# Patient Record
Sex: Female | Born: 1990 | Hispanic: No | Marital: Single | Smoking: Current every day smoker
Health system: Southern US, Community
[De-identification: ages and names within clinical notes are randomized; demographics above are authoritative.]

## PROBLEM LIST (undated history)

## (undated) DIAGNOSIS — R519 Headache, unspecified: Secondary | ICD-10-CM

## (undated) DIAGNOSIS — R51 Headache: Secondary | ICD-10-CM

## (undated) DIAGNOSIS — M419 Scoliosis, unspecified: Secondary | ICD-10-CM

## (undated) HISTORY — PX: NO PAST SURGERIES: SHX2092

## (undated) HISTORY — PX: HERNIA REPAIR: SHX51

---
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
## 2011-01-28 LAB — ANTIBODY SCREEN: Antibody Screen: NEGATIVE

## 2011-01-28 LAB — ABO/RH: RH Type: NEGATIVE

## 2011-07-24 LAB — STREP B DNA PROBE: GBS: NEGATIVE

## 2011-08-16 ENCOUNTER — Inpatient Hospital Stay (HOSPITAL_COMMUNITY)
Admission: AD | Admit: 2011-08-16 | Discharge: 2011-08-19 | DRG: 775 | Disposition: A | Discharge status: Home / Self Care | Payer: Medicaid Other | Source: Ambulatory Visit | Attending: Obstetrics & Gynecology | Admitting: Obstetrics & Gynecology

## 2011-08-16 ENCOUNTER — Inpatient Hospital Stay (HOSPITAL_COMMUNITY): Payer: Medicaid Other | Admitting: Anesthesiology

## 2011-08-16 ENCOUNTER — Encounter (HOSPITAL_COMMUNITY): Payer: Self-pay | Admitting: Anesthesiology

## 2011-08-16 ENCOUNTER — Encounter (HOSPITAL_COMMUNITY): Payer: Self-pay

## 2011-08-16 DIAGNOSIS — N898 Other specified noninflammatory disorders of vagina: Secondary | ICD-10-CM

## 2011-08-16 HISTORY — DX: Scoliosis, unspecified: M41.9

## 2011-08-16 LAB — CBC
Hemoglobin: 12 g/dL (ref 12.0–15.0)
MCH: 29.2 pg (ref 26.0–34.0)
RBC: 4.11 MIL/uL (ref 3.87–5.11)

## 2011-08-16 LAB — RPR: RPR Ser Ql: NONREACTIVE

## 2011-08-16 LAB — AMNISURE RUPTURE OF MEMBRANE (ROM) NOT AT ARMC: Amnisure ROM: POSITIVE

## 2011-08-16 MED ORDER — PHENYLEPHRINE 40 MCG/ML (10ML) SYRINGE FOR IV PUSH (FOR BLOOD PRESSURE SUPPORT)
80.0000 ug | PREFILLED_SYRINGE | INTRAVENOUS | Status: DC | PRN
  Administered 2011-08-16: 80 ug via INTRAVENOUS

## 2011-08-16 MED ORDER — OXYTOCIN BOLUS FROM INFUSION
500.0000 mL | Freq: Once | INTRAVENOUS | Status: DC
  Filled 2011-08-16: qty 1000
  Filled 2011-08-16: qty 500

## 2011-08-16 MED ORDER — FENTANYL 2.5 MCG/ML BUPIVACAINE 1/10 % EPIDURAL INFUSION (WH - ANES)
14.0000 mL/h | INTRAMUSCULAR | Status: DC
  Administered 2011-08-17 (×3): 14 mL/h via EPIDURAL
  Filled 2011-08-16 (×4): qty 60

## 2011-08-16 MED ORDER — ACETAMINOPHEN 325 MG PO TABS
650.0000 mg | ORAL_TABLET | ORAL | Status: DC | PRN
  Administered 2011-08-17: 650 mg via ORAL
  Filled 2011-08-16: qty 2

## 2011-08-16 MED ORDER — CITRIC ACID-SODIUM CITRATE 334-500 MG/5ML PO SOLN
30.0000 mL | ORAL | Status: DC | PRN
  Administered 2011-08-17: 30 mL via ORAL
  Filled 2011-08-16: qty 15

## 2011-08-16 MED ORDER — LACTATED RINGERS IV SOLN
INTRAVENOUS | Status: DC
  Administered 2011-08-16: 21:00:00 via INTRAVENOUS

## 2011-08-16 MED ORDER — FLEET ENEMA 7-19 GM/118ML RE ENEM: 1.0000 | ENEMA | RECTAL | Status: DC | PRN

## 2011-08-16 MED ORDER — OXYTOCIN 20 UNITS IN LACTATED RINGERS INFUSION - SIMPLE
1.0000 m[IU]/min | INTRAVENOUS | Status: DC
  Administered 2011-08-16: 2 m[IU]/min via INTRAVENOUS
  Administered 2011-08-17: 16 m[IU]/min via INTRAVENOUS
  Filled 2011-08-16: qty 1000

## 2011-08-16 MED ORDER — TERBUTALINE SULFATE 1 MG/ML IJ SOLN: 0.2500 mg | Freq: Once | INTRAMUSCULAR | Status: AC | PRN

## 2011-08-16 MED ORDER — EPHEDRINE 5 MG/ML INJ
10.0000 mg | INTRAVENOUS | Status: DC | PRN
  Filled 2011-08-16: qty 4

## 2011-08-16 MED ORDER — IBUPROFEN 600 MG PO TABS
600.0000 mg | ORAL_TABLET | Freq: Four times a day (QID) | ORAL | Status: DC | PRN
  Administered 2011-08-17: 600 mg via ORAL
  Filled 2011-08-16: qty 1

## 2011-08-16 MED ORDER — DIPHENHYDRAMINE HCL 50 MG/ML IJ SOLN: 12.5000 mg | INTRAMUSCULAR | Status: DC | PRN

## 2011-08-16 MED ORDER — ONDANSETRON HCL 4 MG/2ML IJ SOLN: 4.0000 mg | Freq: Four times a day (QID) | INTRAMUSCULAR | Status: DC | PRN

## 2011-08-16 MED ORDER — LIDOCAINE HCL 1.5 % IJ SOLN
INTRAMUSCULAR | Status: DC | PRN
  Administered 2011-08-16 (×2): 4 mL via EPIDURAL

## 2011-08-16 MED ORDER — LIDOCAINE HCL (PF) 1 % IJ SOLN
30.0000 mL | INTRAMUSCULAR | Status: DC | PRN
  Filled 2011-08-16: qty 30

## 2011-08-16 MED ORDER — LACTATED RINGERS IV SOLN: 500.0000 mL | Freq: Once | INTRAVENOUS | Status: DC

## 2011-08-16 MED ORDER — LACTATED RINGERS IV SOLN
500.0000 mL | INTRAVENOUS | Status: DC | PRN
  Administered 2011-08-16: 1000 mL via INTRAVENOUS

## 2011-08-16 MED ORDER — PHENYLEPHRINE 40 MCG/ML (10ML) SYRINGE FOR IV PUSH (FOR BLOOD PRESSURE SUPPORT)
80.0000 ug | PREFILLED_SYRINGE | INTRAVENOUS | Status: DC | PRN
  Filled 2011-08-16: qty 5

## 2011-08-16 MED ORDER — OXYTOCIN 20 UNITS IN LACTATED RINGERS INFUSION - SIMPLE
125.0000 mL/h | Freq: Once | INTRAVENOUS | Status: AC
  Administered 2011-08-17: 125 mL/h via INTRAVENOUS

## 2011-08-16 MED ORDER — EPHEDRINE 5 MG/ML INJ: 10.0000 mg | INTRAVENOUS | Status: DC | PRN

## 2011-08-16 MED ORDER — FENTANYL 2.5 MCG/ML BUPIVACAINE 1/10 % EPIDURAL INFUSION (WH - ANES)
INTRAMUSCULAR | Status: DC | PRN
  Administered 2011-08-16: 14 mL/h via EPIDURAL

## 2011-08-16 MED ORDER — OXYCODONE-ACETAMINOPHEN 5-325 MG PO TABS
2.0000 | ORAL_TABLET | ORAL | Status: DC | PRN
  Administered 2011-08-17: 1 via ORAL
  Filled 2011-08-16: qty 1

## 2011-08-16 MED ORDER — NALBUPHINE SYRINGE 5 MG/0.5 ML: 5.0000 mg | INJECTION | INTRAMUSCULAR | Status: DC | PRN

## 2011-08-16 NOTE — Progress Notes (Signed)
Patient states that about 0100 she got up to go to the bathroom and her panties were wetter than usual. Several times during the night she got up and the tissue was wet. No active leaking, just continues to feel wet. Reports good fetal movement, no contractions. Was 3 cm. On 12-26.

## 2011-08-16 NOTE — Anesthesia Procedure Notes (Signed)
Epidural Patient location during procedure: OB Start time: 08/16/2011 8:35 PM  Staffing Anesthesiologist: Sheliah Fiorillo A. Performed by: anesthesiologist   Preanesthetic Checklist Completed: patient identified, site marked, surgical consent, pre-op evaluation, timeout performed, IV checked, risks and benefits discussed and monitors and equipment checked  Epidural Patient position: sitting Prep: site prepped and draped and DuraPrep Patient monitoring: continuous pulse ox and blood pressure Approach: midline Injection technique: LOR air  Needle:  Needle type: Tuohy  Needle gauge: 17 G Needle length: 9 cm Needle insertion depth: 4 cm Catheter type: closed end flexible Catheter size: 19 Gauge Catheter at skin depth: 9 cm Test dose: negative and 1.5% lidocaine  Assessment Events: blood not aspirated, injection not painful, no injection resistance, negative IV test and no paresthesia  Additional Notes Patient is more comfortable after epidural dosed. Please see RN's note for documentation of vital signs and FHR which are stable.

## 2011-08-16 NOTE — ED Provider Notes (Addendum)
History     Chief Complaint  Patient presents with  . Rupture of Membranes   HPI This is a 20yo G1 at 40weeks 1 day who presents with question of SROM.  Had milky discharge on underwear. No foul smell, no vaginal itching.  Intermittent irregular contractions.  Good fetal activity.  No vaginal bleeding.  OB History    Grav Para Term Preterm Abortions TAB SAB Ect Mult Living   1 0 0 0 0 0 0 0 0 0       Past Medical History  Diagnosis Date  . Scoliosis     Past Surgical History  Procedure Date  . No past surgeries     History reviewed. No pertinent family history.  History  Substance Use Topics  . Smoking status: Never Smoker   . Smokeless tobacco: Not on file  . Alcohol Use: No    Allergies: No Known Allergies  Prescriptions prior to admission  Medication Sig Dispense Refill  . famotidine (PEPCID) 10 MG tablet Take 10 mg by mouth.          Review of Systems  All other systems reviewed and are negative.   Physical Exam   Blood pressure 137/74, pulse 108, temperature 98.7 F (37.1 C), temperature source Oral, resp. rate 20, height 5\' 3"  (1.6 m), weight 67.586 kg (149 lb), SpO2 99.00%.  Physical Exam  Constitutional: She is oriented to person, place, and time. She appears well-developed and well-nourished.  Cardiovascular: Normal rate.   Respiratory: Effort normal.  GI: Soft. Bowel sounds are normal.       EFW 6.5# by leopolds.  Genitourinary:       Milky discharge.  No fluid from os with valsalva.  Neurological: She is alert and oriented to person, place, and time.  Skin: Skin is warm and dry.  Psychiatric: She has a normal mood and affect. Her behavior is normal. Judgment and thought content normal.   Dilation: 3 Effacement (%): 70 Station: -2 Exam by:: Dr. Adrian Blackwater  Prenatal labs: ABO, Rh: A/Negative/-- (06/11 0000) Antibody: Negative (06/11 0000) Rubella:  immune RPR:   nonreactive HBsAg: Negative (06/11 0000)  HIV: Non-reactive (06/11 0000)    GBS: Negative (12/05 0000)    Ferning negative. Amnisure: positive  NST: category 1 with baseline 120s.  Occasional contraction seen. MAU Course  Procedures  MDM Stable cervical exam.  FHT reassuring.    Assessment and Plan  1.  G1P0 at term 2.  SROM  Will admit, start pitocin.  [facility] for pediatric provider.  Bottle feed.  OCP or depo provera for birth control.  Shefali Ng JEHIEL 08/16/2011, 1:22 PM

## 2011-08-16 NOTE — Progress Notes (Signed)
Subjective: Patient uncomfortable.  Feeling contractions about every 3 minutes.  Objective: BP 133/82  Pulse 95  Temp(Src) 98.5 F (36.9 C) (Oral)  Resp 18  Ht 5\' 3"  (1.6 m)  Wt 67.586 kg (149 lb)  BMI 26.39 kg/m2  SpO2 99%      FHT:  FHR: 130s bpm, variability: moderate,  accelerations:  Present,  decelerations:  Absent UC:   regular, every 3 minutes SVE:   Dilation: 4 Effacement (%): 80 Station: -2 Exam by:: Penley, RN   Labs: Lab Results  Component Value Date   WBC 13.9* 08/16/2011   HGB 12.0 08/16/2011   HCT 36.1 08/16/2011   MCV 87.8 08/16/2011   PLT 258 08/16/2011    Assessment / Plan: Titrating pitocin.  Will consult anesthesiologist for epidural.  Category 1 tracing.  Jasmine Wright, Jasmine Wright 08/16/2011, 8:06 PM

## 2011-08-16 NOTE — Anesthesia Preprocedure Evaluation (Signed)
Anesthesia Evaluation  Patient identified by MRN, date of birth, ID band Patient awake    Reviewed: Allergy & Precautions, H&P , Patient's Chart, lab work & pertinent test results  Airway Mallampati: III TM Distance: >3 FB Neck ROM: full    Dental No notable dental hx. (+) Teeth Intact   Pulmonary neg pulmonary ROS,  clear to auscultation  Pulmonary exam normal       Cardiovascular neg cardio ROS regular Normal    Neuro/Psych Negative Neurological ROS  Negative Psych ROS   GI/Hepatic negative GI ROS, Neg liver ROS,   Endo/Other  Negative Endocrine ROS  Renal/GU negative Renal ROS  Genitourinary negative   Musculoskeletal   Abdominal   Peds  Hematology negative hematology ROS (+)   Anesthesia Other Findings   Reproductive/Obstetrics (+) Pregnancy                           Anesthesia Physical Anesthesia Plan  ASA: II  Anesthesia Plan: Epidural   Post-op Pain Management:    Induction:   Airway Management Planned:   Additional Equipment:   Intra-op Plan:   Post-operative Plan:   Informed Consent: I have reviewed the patients History and Physical, chart, labs and discussed the procedure including the risks, benefits and alternatives for the proposed anesthesia with the patient or authorized representative who has indicated his/her understanding and acceptance.     Plan Discussed with: Anesthesiologist and Surgeon  Anesthesia Plan Comments:         Anesthesia Quick Evaluation  

## 2011-08-16 NOTE — H&P (Signed)
  See mau note

## 2011-08-17 ENCOUNTER — Encounter (HOSPITAL_COMMUNITY): Payer: Self-pay | Admitting: *Deleted

## 2011-08-17 MED ORDER — DIPHENHYDRAMINE HCL 25 MG PO CAPS: 25.0000 mg | ORAL_CAPSULE | Freq: Four times a day (QID) | ORAL | Status: DC | PRN

## 2011-08-17 MED ORDER — BENZOCAINE-MENTHOL 20-0.5 % EX AERO: 1.0000 "application " | INHALATION_SPRAY | CUTANEOUS | Status: DC | PRN

## 2011-08-17 MED ORDER — LANOLIN HYDROUS EX OINT: TOPICAL_OINTMENT | CUTANEOUS | Status: DC | PRN

## 2011-08-17 MED ORDER — ONDANSETRON HCL 4 MG/2ML IJ SOLN: 4.0000 mg | INTRAMUSCULAR | Status: DC | PRN

## 2011-08-17 MED ORDER — BENZOCAINE-MENTHOL 20-0.5 % EX AERO
INHALATION_SPRAY | CUTANEOUS | Status: AC
  Filled 2011-08-17: qty 56

## 2011-08-17 MED ORDER — SENNOSIDES-DOCUSATE SODIUM 8.6-50 MG PO TABS
2.0000 | ORAL_TABLET | Freq: Every day | ORAL | Status: DC
  Administered 2011-08-17 – 2011-08-18 (×2): 2 via ORAL

## 2011-08-17 MED ORDER — MISOPROSTOL 200 MCG PO TABS
ORAL_TABLET | ORAL | Status: AC
  Filled 2011-08-17: qty 5

## 2011-08-17 MED ORDER — WITCH HAZEL-GLYCERIN EX PADS: 1.0000 "application " | MEDICATED_PAD | CUTANEOUS | Status: DC | PRN

## 2011-08-17 MED ORDER — MISOPROSTOL 200 MCG PO TABS
1000.0000 ug | ORAL_TABLET | Freq: Once | ORAL | Status: AC
  Administered 2011-08-17: 1000 ug via VAGINAL

## 2011-08-17 MED ORDER — OXYCODONE-ACETAMINOPHEN 5-325 MG PO TABS
1.0000 | ORAL_TABLET | ORAL | Status: DC | PRN
  Administered 2011-08-18 (×2): 1 via ORAL
  Filled 2011-08-17 (×2): qty 1

## 2011-08-17 MED ORDER — SIMETHICONE 80 MG PO CHEW: 80.0000 mg | CHEWABLE_TABLET | ORAL | Status: DC | PRN

## 2011-08-17 MED ORDER — ZOLPIDEM TARTRATE 5 MG PO TABS: 5.0000 mg | ORAL_TABLET | Freq: Every evening | ORAL | Status: DC | PRN

## 2011-08-17 MED ORDER — TETANUS-DIPHTH-ACELL PERTUSSIS 5-2.5-18.5 LF-MCG/0.5 IM SUSP: 0.5000 mL | Freq: Once | INTRAMUSCULAR | Status: DC

## 2011-08-17 MED ORDER — DEXTROSE 5 % IV SOLN
1.0000 g | Freq: Two times a day (BID) | INTRAVENOUS | Status: DC
  Administered 2011-08-17 (×2): 1 g via INTRAVENOUS
  Filled 2011-08-17 (×3): qty 1

## 2011-08-17 MED ORDER — PRENATAL MULTIVITAMIN CH
1.0000 | ORAL_TABLET | Freq: Every day | ORAL | Status: DC
  Administered 2011-08-17 – 2011-08-19 (×3): 1 via ORAL
  Filled 2011-08-17 (×3): qty 1

## 2011-08-17 MED ORDER — IBUPROFEN 600 MG PO TABS
600.0000 mg | ORAL_TABLET | Freq: Four times a day (QID) | ORAL | Status: DC
  Administered 2011-08-17 – 2011-08-19 (×8): 600 mg via ORAL
  Filled 2011-08-17 (×8): qty 1

## 2011-08-17 MED ORDER — ONDANSETRON HCL 4 MG PO TABS: 4.0000 mg | ORAL_TABLET | ORAL | Status: DC | PRN

## 2011-08-17 MED ORDER — DIBUCAINE 1 % RE OINT: 1.0000 "application " | TOPICAL_OINTMENT | RECTAL | Status: DC | PRN

## 2011-08-17 NOTE — Anesthesia Postprocedure Evaluation (Signed)
  Anesthesia Post-op Note  Patient: Jasmine Wright  Procedure(s) Performed: * No procedures listed *  Patient Location: PACU and Mother/Baby  Anesthesia Type: Epidural  Level of Consciousness: awake, alert  and oriented  Airway and Oxygen Therapy: Patient Spontanous Breathing   Post-op Assessment: Patient's Cardiovascular Status Stable and Respiratory Function Stable  Post-op Vital Signs: stable  Complications: No apparent anesthesia complications

## 2011-08-17 NOTE — Progress Notes (Signed)
Subjective: Patient feeling some pressure.  Objective: BP 116/58  Pulse 100  Temp(Src) 99.4 F (37.4 C) (Oral)  Resp 18  Ht 5\' 3"  (1.6 m)  Wt 67.586 kg (149 lb)  BMI 26.39 kg/m2  SpO2 99%     FHT:  FHR: 130s140s bpm, variability: moderate,  accelerations:  Present,  decelerations:  Absent UC:   regular, every 23 minutes SVE:   Dilation: 10 Effacement (%): 100 Station: +2 Exam by:: Jasmine Neuharth,MD  Labs: Lab Results  Component Value Date   WBC 13.9* 08/16/2011   HGB 12.0 08/16/2011   HCT 36.1 08/16/2011   MCV 87.8 08/16/2011   PLT 258 08/16/2011    Assessment / Plan: Patient complete.  Will start pushing.  Jasmine Wright Jasmine Wright 08/17/2011, 4:49 AM

## 2011-08-17 NOTE — Progress Notes (Signed)
Subjective: Patient more uncomfortable.  Some pressure.  Objective: BP 114/51  Pulse 104  Temp(Src) 99.2 F (37.3 C) (Oral)  Resp 18  Ht 5\' 3"  (1.6 m)  Wt 67.586 kg (149 lb)  BMI 26.39 kg/m2  SpO2 99%      FHT:  FHR: 130 bpm, variability: moderate,  accelerations:  Present,  decelerations:  Present earlies UC:   regular, every 2 minutes SVE:   Dilation: Lip/rim Effacement (%): 100 Station: 0;+1 Exam by:: Adrian Blackwater, MD   Labs: Lab Results  Component Value Date   WBC 13.9* 08/16/2011   HGB 12.0 08/16/2011   HCT 36.1 08/16/2011   MCV 87.8 08/16/2011   PLT 258 08/16/2011    Assessment / Plan: Continue with pit.  WIll recheck in 45 minutes and start pushing if complete.  STINSON, Jasmine Wright 08/17/2011, 2:42 AM

## 2011-08-18 MED ORDER — RHO D IMMUNE GLOBULIN 1500 UNIT/2ML IJ SOLN
300.0000 ug | Freq: Once | INTRAMUSCULAR | Status: AC
  Administered 2011-08-18: 300 ug via INTRAMUSCULAR
  Filled 2011-08-18: qty 2

## 2011-08-18 NOTE — Progress Notes (Signed)
Post Partum Day #1 Subjective: no complaints, up ad lib and tolerating PO; bottlefeeding; on cefotetan due to T 103 at approx 12:30p 12/29 and afeb since  Objective: Blood pressure 109/67, pulse 78, temperature 98 F (36.7 C), temperature source Oral, resp. rate 18, height 5\' 3"  (1.6 m), weight 67.586 kg (149 lb), SpO2 84.00%, unknown if currently breastfeeding.  Physical Exam:  General: alert, cooperative and mild distress Lochia: appropriate Uterine Fundus: firm DVT Evaluation: No evidence of DVT seen on physical exam.   Basename 08/16/11 1425  HGB 12.0  HCT 36.1    Assessment/Plan: Plan for discharge tomorrow Continue cefotetan for at least 24 hr from last fever   LOS: 2 days   Cam Hai 08/18/2011, 7:27 AM

## 2011-08-19 LAB — RH IG WORKUP (INCLUDES ABO/RH)
ABO/RH(D): A NEG
Antibody Screen: NEGATIVE
Fetal Screen: NEGATIVE
Gestational Age(Wks): 40
Unit division: 0

## 2011-08-19 MED ORDER — PRENATAL MULTIVITAMIN CH: 1.0000 | ORAL_TABLET | Freq: Every day | ORAL | Status: DC

## 2011-08-19 MED ORDER — IBUPROFEN 600 MG PO TABS: 600.0000 mg | ORAL_TABLET | Freq: Four times a day (QID) | ORAL | Status: AC

## 2011-08-19 NOTE — Progress Notes (Signed)
UR chart review completed.  

## 2011-08-19 NOTE — Discharge Summary (Signed)
Obstetric Discharge Summary Reason for Admission: onset of labor Prenatal Procedures: none Intrapartum Procedures: spontaneous vaginal delivery Postpartum Procedures: none Complications-Operative and Postpartum: none Hemoglobin  Date Value Range Status  08/16/2011 12.0  12.0-15.0 (g/dL) Final     HCT  Date Value Range Status  08/16/2011 36.1  36.0-46.0 (%) Final    Discharge Diagnoses: Term Pregnancy-delivered  Discharge Information: Date: 08/19/2011 Activity: pelvic rest Diet: routine Medications: PNV and Ibuprofen Condition: stable Instructions: refer to practice specific booklet Discharge to: home Follow-up Information    Make an appointment with [facility]. (in 4-6 weeks for postpartum visit)          Newborn Data: Live born female  Birth Weight: 8 lb 11.3 oz (3949 g) APGAR: 7, 9  Home with mother.  Maudie Shingledecker 08/19/2011, 9:14 AM

## 2011-08-19 NOTE — Progress Notes (Addendum)
Post Partum Day 2 Subjective: no complaints, up ad lib, voiding, tolerating PO and + flatus  Objective: Blood pressure 109/70, pulse 89, temperature 97.5 F (36.4 C), temperature source Oral, resp. rate 18, height 5\' 3"  (1.6 m), weight 67.586 kg (149 lb), SpO2 84.00%, unknown if currently breastfeeding.  Physical Exam:  General: alert, cooperative and no distress Lochia: appropriate Uterine Fundus: firm Incision: n/a DVT Evaluation: No evidence of DVT seen on physical exam. Negative Homan's sign. No cords or calf tenderness. No significant calf/ankle edema.   Basename 08/16/11 1425  HGB 12.0  HCT 36.1    Assessment/Plan: Discharge home Planning Depo-provera for birth control   LOS: 3 days   Allan Bacigalupi 08/19/2011, 9:11 AM

## 2014-06-20 ENCOUNTER — Encounter (HOSPITAL_COMMUNITY): Payer: Self-pay | Admitting: *Deleted

## 2014-10-26 ENCOUNTER — Ambulatory Visit (INDEPENDENT_AMBULATORY_CARE_PROVIDER_SITE_OTHER): Payer: Medicaid Other | Admitting: Advanced Practice Midwife

## 2014-10-26 ENCOUNTER — Encounter: Payer: Self-pay | Admitting: Advanced Practice Midwife

## 2014-10-26 VITALS — BP 122/70 | HR 72 | Ht 63.0 in | Wt 106.0 lb

## 2014-10-26 DIAGNOSIS — Z3046 Encounter for surveillance of implantable subdermal contraceptive: Secondary | ICD-10-CM | POA: Insufficient documentation

## 2014-10-26 DIAGNOSIS — Z3049 Encounter for surveillance of other contraceptives: Secondary | ICD-10-CM

## 2014-10-26 NOTE — Progress Notes (Signed)
HPI:  Tona SensingJulie Dray 24 y.o. here for Nexplanon removal.  She has had it a little over 3 years. Her future plans for birth control are condoms.  Past Medical History: Past Medical History  Diagnosis Date  . Scoliosis     Past Surgical History: Past Surgical History  Procedure Laterality Date  . No past surgeries      Family History: Family History  Problem Relation Age of Onset  . Hypertension Mother     Social History: History  Substance Use Topics  . Smoking status: Current Every Day Smoker -- 5.00 packs/day    Types: Cigarettes  . Smokeless tobacco: Never Used  . Alcohol Use: No    Allergies: No Known Allergies   Patient given informed consent for removal of her Nexplanon, time out was performed.  Signed copy in the chart.  Appropriate time out taken. Implanon site identified.  Area prepped in usual sterile fashon. One cc of 1% lidocaine was used to anesthetize the area at the distal end of the implant. A small stab incision was made right beside the implant on the distal portion.  The Nexplanon rod was grasped using hemostats and removed without difficulty.  There was less than 3 cc blood loss. There were no complications.  A small amount of antibiotic ointment and steri-strips were applied over the small incision.  A pressure bandage was applied to reduce any bruising.  The patient tolerated the procedure well and was given post procedure instructions.\\Discussed Plan B

## 2015-05-11 ENCOUNTER — Other Ambulatory Visit (HOSPITAL_COMMUNITY): Payer: Self-pay | Admitting: Unknown Physician Specialty

## 2015-05-11 DIAGNOSIS — Z3689 Encounter for other specified antenatal screening: Secondary | ICD-10-CM

## 2015-05-11 DIAGNOSIS — O43212 Placenta accreta, second trimester: Secondary | ICD-10-CM

## 2015-05-11 DIAGNOSIS — Z3A24 24 weeks gestation of pregnancy: Secondary | ICD-10-CM

## 2015-05-16 ENCOUNTER — Ambulatory Visit (HOSPITAL_COMMUNITY): Admission: RE | Admit: 2015-05-16 | Payer: Medicaid Other | Source: Ambulatory Visit

## 2015-05-16 ENCOUNTER — Encounter (HOSPITAL_COMMUNITY): Payer: Self-pay

## 2015-05-16 ENCOUNTER — Other Ambulatory Visit (HOSPITAL_COMMUNITY): Payer: Self-pay | Admitting: Unknown Physician Specialty

## 2015-05-16 ENCOUNTER — Ambulatory Visit (HOSPITAL_COMMUNITY)
Admission: RE | Admit: 2015-05-16 | Discharge: 2015-05-16 | Disposition: A | Discharge status: Home / Self Care | Payer: Medicaid Other | Source: Ambulatory Visit | Attending: Unknown Physician Specialty | Admitting: Unknown Physician Specialty

## 2015-05-16 DIAGNOSIS — Z3A24 24 weeks gestation of pregnancy: Secondary | ICD-10-CM | POA: Insufficient documentation

## 2015-05-16 DIAGNOSIS — O43212 Placenta accreta, second trimester: Secondary | ICD-10-CM | POA: Insufficient documentation

## 2015-05-16 DIAGNOSIS — Z36 Encounter for antenatal screening of mother: Secondary | ICD-10-CM | POA: Insufficient documentation

## 2015-05-16 DIAGNOSIS — Z3689 Encounter for other specified antenatal screening: Secondary | ICD-10-CM

## 2015-05-16 HISTORY — DX: Headache: R51

## 2015-05-16 HISTORY — DX: Headache, unspecified: R51.9

## 2015-05-19 ENCOUNTER — Other Ambulatory Visit (HOSPITAL_COMMUNITY): Payer: Self-pay | Admitting: Unknown Physician Specialty

## 2016-03-20 ENCOUNTER — Encounter (HOSPITAL_COMMUNITY): Payer: Self-pay

## 2016-09-24 IMAGING — US US MFM OB DETAIL+14 WK
1 series · 14 of 28 positions shown · non-contrast
Comparison: none

[Series 1: us mfm ob detail+14 wk · 99 acquisitions, 14 frames shown]
[im 4/99]
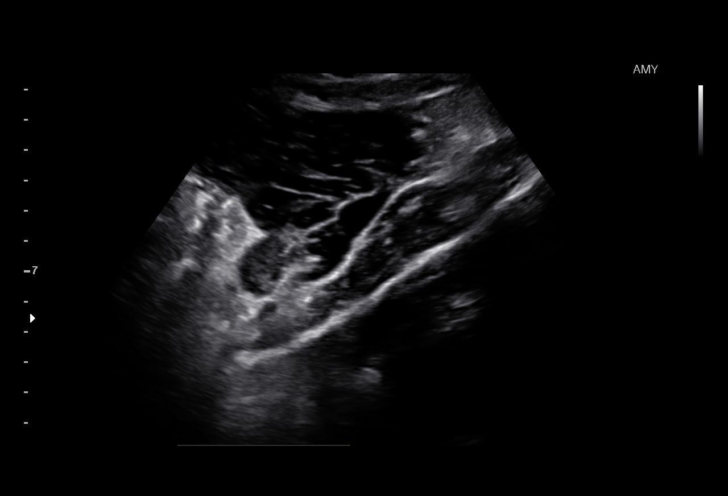
[im 11/99]
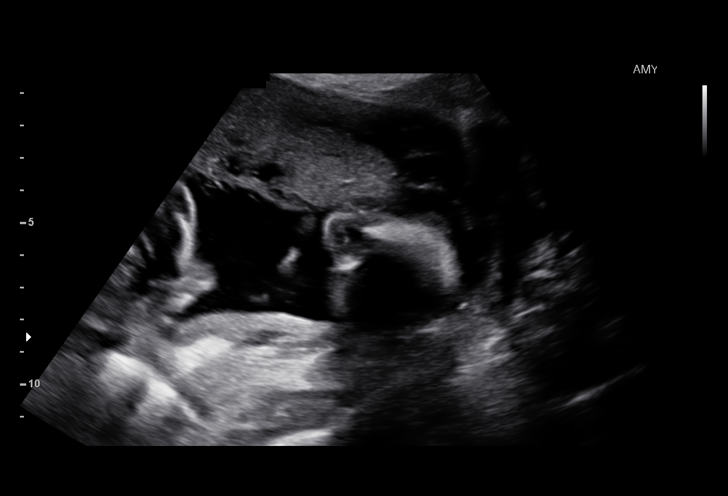
[im 19/99]
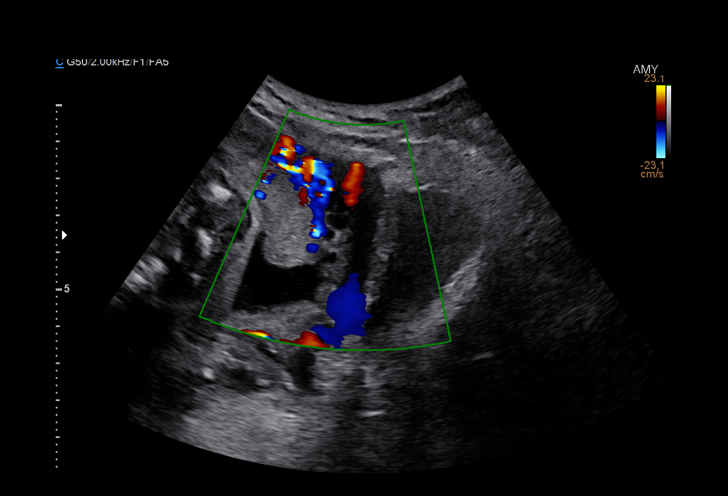
[im 26/99]
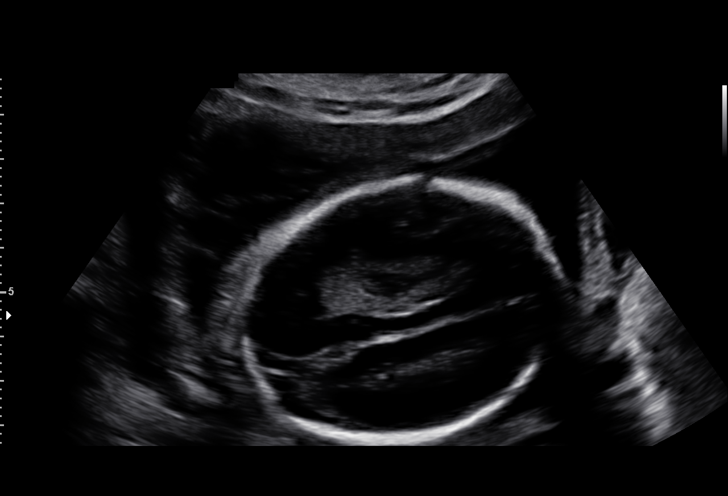
[im 33/99]
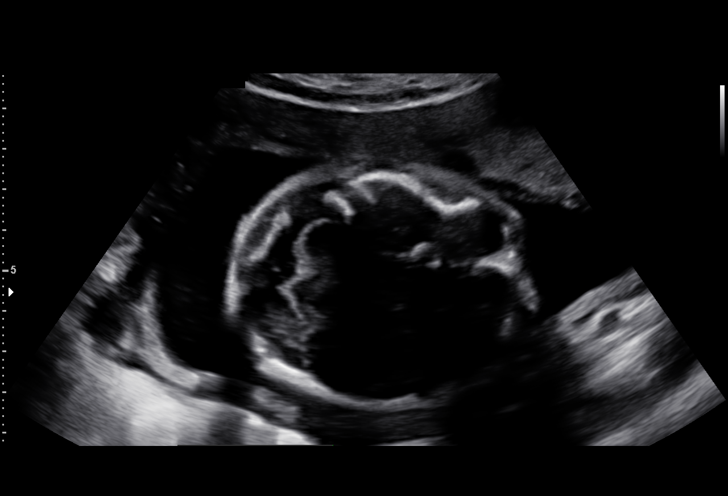
[im 40/99]
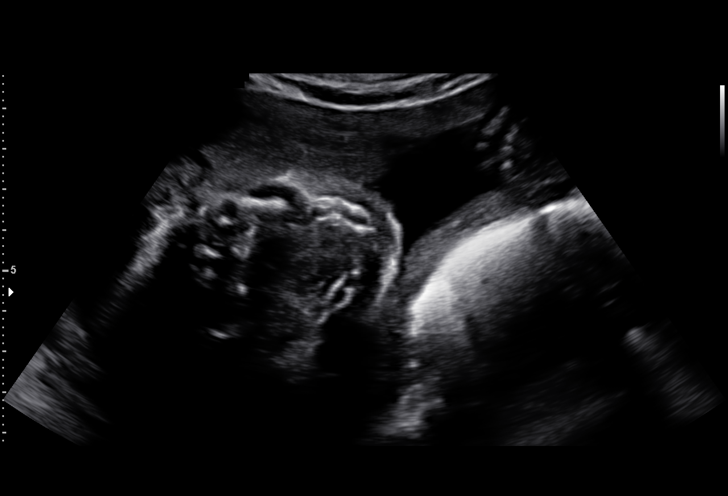
[im 48/99]
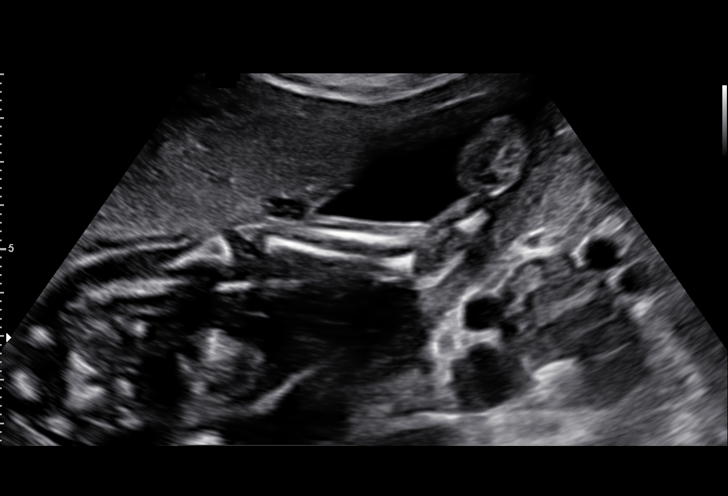
[im 55/99]
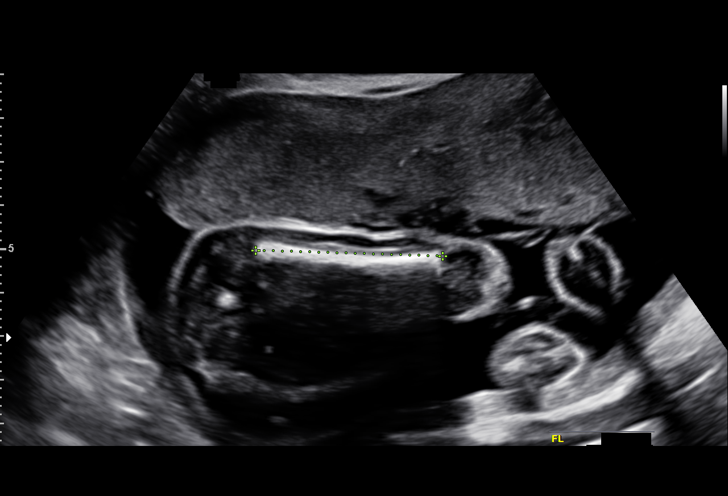
[im 62/99]
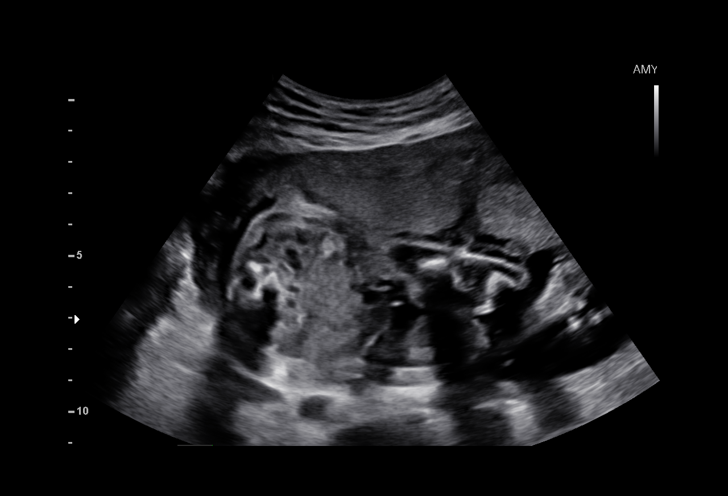
[im 69/99]
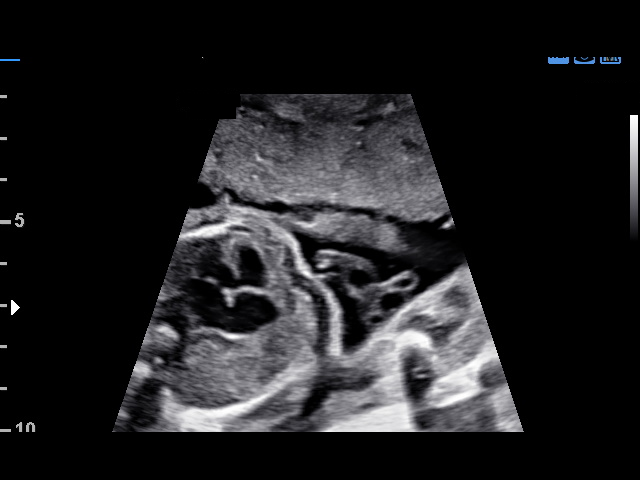
[im 77/99]
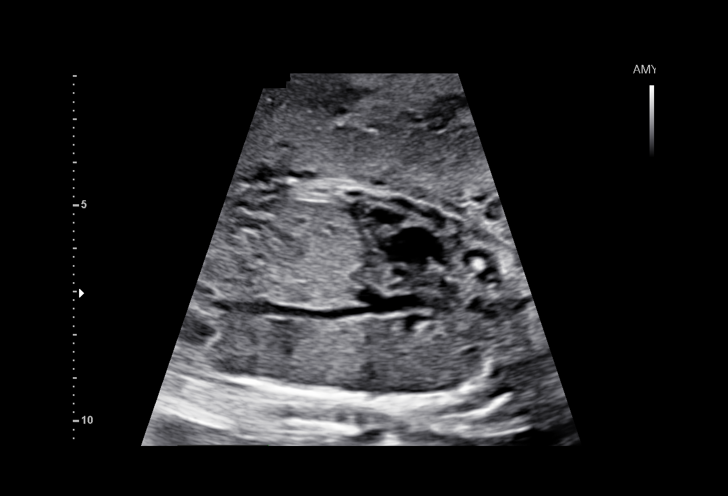
[im 84/99]
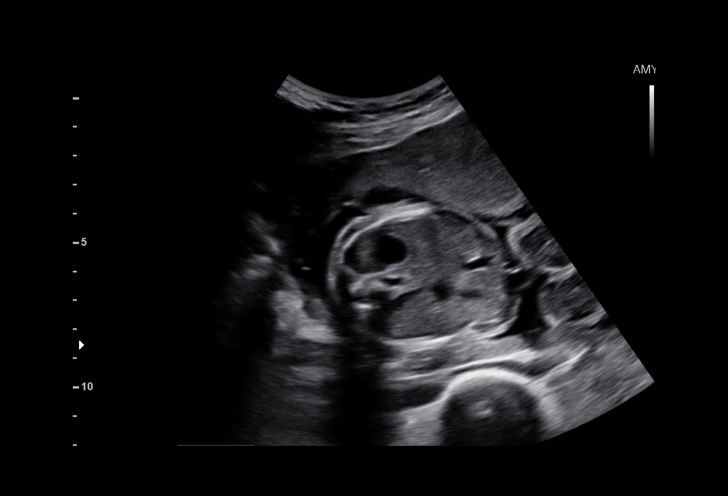
[im 91/99]
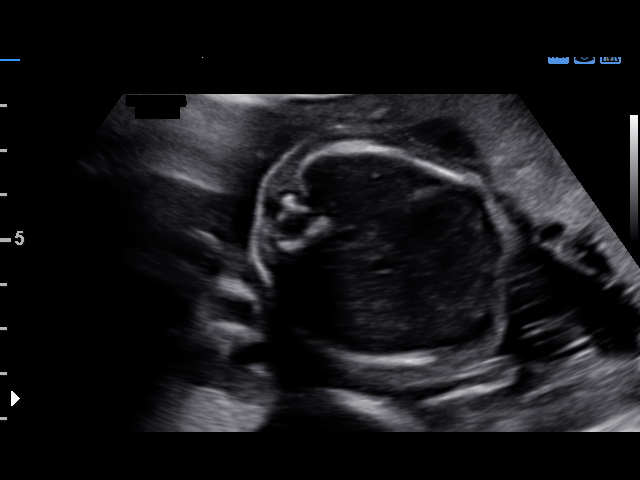
[im 99/99]
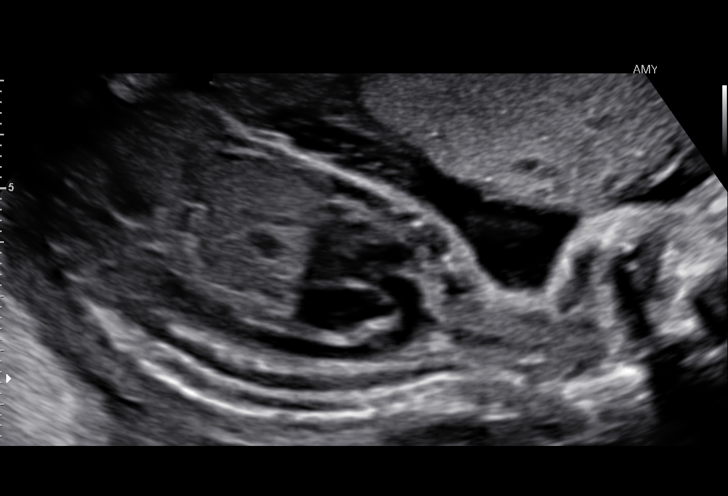

[14 of 28 positions shown; findings below may reference images not displayed]

OBSTETRICS REPORT
(Signed Final 05/16/2015 [DATE])

Name:       ANAT IVETTE                          Visit  05/16/2015 [DATE]
Date:

Service(s) Provided

Indications

Detailed fetal anatomic survey                         Z36
24 weeks gestation of pregnancy
Placenta accreta in second trimester
Fetal Evaluation

Num Of             1
Fetuses:
Fetal Heart        161                          bpm
Rate:
Cardiac Activity:  Observed
Presentation:      Cephalic
Placenta:          Anterior, above cervical
os
P. Cord            Visualized
Insertion:

Amniotic Fluid
AFI FV:      Subjectively within normal limits
Larg Pckt:      4.7  cm
Biometry

BPD:     61.2   m    G. Age:   24w 6d                 CI:         72.4   70 - 86
m
FL/HC:      18.9   18.7 -
20.3
HC:     228.8   m    G. Age:   24w 6d        31  %    HC/AC:      1.17   1.04 -
m
AC:       196   m    G. Age:   24w 2d        24  %    FL/BPD      70.6   71 - 87
m                                     :
FL:      43.2   m    G. Age:   24w 1d        18  %    FL/AC:      22.0   20 - 24
m
HUM:     39.8   m    G. Age:   24w 2d        28  %
m
CER:     27.2   m    G. Age:   24w 4d        42  %
m
Est.         684   gm    1 lb 8 oz      40   %
FW:
Gestational Age

U/S Today:     24w 4d                                         EDD:   09/01/15
Best:          24w 6d    Det. By:   Previous Ultrasound       EDD:   08/30/15
Anatomy

Cranium:          Appears normal         Aortic Arch:       Appears normal
Fetal Cavum:      Appears normal         Ductal Arch:       Appears normal
Ventricles:       Appears normal         Diaphragm:         Appears normal
Choroid Plexus:   Appears normal         Stomach:           Appears normal,
left sided
Cerebellum:       Appears normal         Abdomen:           Appears normal
Posterior         Appears normal         Abdominal          Appears nml (cord
Fossa:                                   Wall:              insert, abd wall)
Nuchal Fold:      Not applicable (>20    Cord Vessels:      Appears normal (3
wks GA)                                   vessel cord)
Face:             Appears normal         Kidneys:           Appear normal
(orbits and profile)
Lips:             Appears normal         Bladder:           Appears normal
Palate:           Appears normal         Spine:             Appears normal
Heart:            Appears normal         Lower              Appears normal
(4CH, axis, and        Extremities:
situs)
RVOT:             Appears normal         Upper              Appears normal
Extremities:
LVOT:             Appears normal

Other:   Fetus appears to be a female. Heels appears normal.
Targeted Anatomy

Fetal Central Nervous System
Cisterna
Magna:
Cervix Uterus Adnexa

Cervical Length:    3.37      cm

Cervix:       Normal appearance by transabdominal scan.
Appears closed, without funnelling.

Left Ovary:    Not visualized. No adnexal mass visualized.
Right Ovary:   Within normal limits.
Comments

Ms. Landicho is 24 yo G2P1 seen due to suspicion of
placenta accreta on ultrasound.  She denies any prior
uterine surgery and had an anterior placenta.  While some
venous lakes are noted, no other sonographic findings are
appreciated that are suspicious for morbidly adherent
placenta and feel that this is a normal anatomic variant.
Would recommend routine follow up and cesarean delivery
for obstetric indications only.
Impression

Single IUP at 24w 6d
Normal anatomic fetal survey
Fetal growth is appropriate (40th %tile)
Anterior placenta without previa (see comments)
Normal amniotic fluid volume
Recommendations

Follow-up ultrasounds as clinically indicated.

## 2016-10-28 ENCOUNTER — Encounter (HOSPITAL_COMMUNITY): Payer: Self-pay | Admitting: *Deleted

## 2016-10-28 ENCOUNTER — Emergency Department (HOSPITAL_COMMUNITY)
Admission: EM | Admit: 2016-10-28 | Discharge: 2016-10-29 | Disposition: A | Discharge status: Home / Self Care | Payer: Medicaid Other | Attending: Emergency Medicine | Admitting: Emergency Medicine

## 2016-10-28 DIAGNOSIS — Z3A20 20 weeks gestation of pregnancy: Secondary | ICD-10-CM | POA: Diagnosis not present

## 2016-10-28 DIAGNOSIS — K029 Dental caries, unspecified: Secondary | ICD-10-CM | POA: Insufficient documentation

## 2016-10-28 DIAGNOSIS — K047 Periapical abscess without sinus: Secondary | ICD-10-CM

## 2016-10-28 DIAGNOSIS — Z87891 Personal history of nicotine dependence: Secondary | ICD-10-CM | POA: Insufficient documentation

## 2016-10-28 DIAGNOSIS — O99612 Diseases of the digestive system complicating pregnancy, second trimester: Secondary | ICD-10-CM | POA: Insufficient documentation

## 2016-10-28 DIAGNOSIS — K0889 Other specified disorders of teeth and supporting structures: Secondary | ICD-10-CM

## 2016-10-28 NOTE — ED Provider Notes (Signed)
[facility]EM[facility]ENCY DEPT Provider Note   CSN: [id number] Arrival date & time: 10/28/16  2322   By signing my name below, I, Nelwyn Salisbury, attest that this documentation has been prepared under the direction and in the presence of Devoria Albe, MD . Electronically Signed: Nelwyn Salisbury, Scribe. 10/28/2016. 11:48 PM.  Time seen 12:12 AM  History   Chief Complaint Chief Complaint  Patient presents with  . Dental Pain   The history is provided by the patient. No language interpreter was used.    HPI Comments:  Jasmine Wright is a 5 months pregnant  26 y.o. female who presents to the Emergency Department complaining of constant, unchanged, moderate dental pain which began 5 days ago. Pt states that her pain is localized to the bottom right side of her mouth, and is exacerbated by chewing and cold liquids or foods. No modifying factors indicated. Pt denies any fever, trouble swallowing or trouble breathing. She is a nonsmoker, does not drink and works as a Location manager. Pt has an appointment with her dentist tomorrow, March 14.    PCP: Vernice Jefferson   Past Medical History:  Diagnosis Date  . Headache   . Scoliosis     Patient Active Problem List   Diagnosis Date Noted  . Nexplanon removal 10/26/2014    Past Surgical History:  Procedure Laterality Date  . HERNIA REPAIR    . NO PAST SURGERIES      OB History    Gravida Para Term Preterm AB Living   3 1 1  0 0 1   SAB TAB Ectopic Multiple Live Births   0 0 0 0 1       Home Medications    Prior to Admission medications   Medication Sig Start Date End Date Taking? Authorizing Provider  penicillin v potassium (VEETID) 500 MG tablet Take 1 tablet (500 mg total) by mouth 4 (four) times daily. 10/29/16 11/05/16  Devoria Albe, MD  Prenatal Vit-Fe Fumarate-FA (PRENATAL MULTIVITAMIN) TABS Take 1 tablet by mouth daily. 08/19/11   Catalina Antigua, MD    Family History Family History  Problem Relation Age of Onset  . Hypertension Mother      Social History Social History  Substance Use Topics  . Smoking status: Former Smoker    Packs/day: 5.00    Types: Cigarettes  . Smokeless tobacco: Never Used  . Alcohol use No  employed   Allergies   Patient has no known allergies.   Review of Systems Review of Systems  Constitutional: Negative for fever.  HENT: Positive for dental problem. Negative for trouble swallowing.   Respiratory: Negative for shortness of breath.   All other systems reviewed and are negative.    Physical Exam Updated Vital Signs BP 112/72 (BP Location: Right Arm)   Pulse 86   Temp 98.2 F (36.8 C) (Oral)   Resp 18   Ht 5\' 3"  (1.6 m)   Wt 125 lb (56.7 kg)   SpO2 99%   BMI 22.14 kg/m   Physical Exam  Constitutional: She appears well-developed and well-nourished. No distress.  HENT:  Head: Normocephalic and atraumatic.  Right Ear: External ear normal.  Left Ear: External ear normal.  Nose: Nose normal.  Mouth/Throat:    Eyes: Conjunctivae and EOM are normal.  Neck: Normal range of motion.  Cardiovascular: Normal rate and regular rhythm.   No murmur heard. Pulmonary/Chest: Effort normal. No respiratory distress.  Musculoskeletal: Normal range of motion.  Neurological: She is alert.  Skin: Skin is  warm and dry.  Psychiatric: She has a normal mood and affect.  Nursing note and vitals reviewed.    ED Treatments / Results   Procedures Procedures (including critical care time)  Medications Ordered in ED Medications  penicillin v potassium (VEETID) tablet 500 mg (500 mg Oral Given 10/29/16 0022)     Initial Impression / Assessment and Plan / ED Course  I have reviewed the triage vital signs and the nursing notes.  Pertinent labs & imaging results that were available during my care of the patient were reviewed by me and considered in my medical decision making (see chart for details).      COORDINATION OF CARE:  12:16 AM Discussed treatment plan with pt at bedside  which includes abx and pt agreed to plan. Pt can take acetaminophen for pain b/o her pregnancy.     Final Clinical Impressions(s) / ED Diagnoses   Final diagnoses:  Infected tooth  Dental caries  Pain, dental    New Prescriptions Discharge Medication List as of 10/29/2016 12:17 AM    START taking these medications   Details  penicillin v potassium (VEETID) 500 MG tablet Take 1 tablet (500 mg total) by mouth 4 (four) times daily., Starting Tue 10/29/2016, Until Tue 11/05/2016, Print        Plan discharge  Devoria AlbeIva Joseph Bias, MD, FACEP  I personally performed the services described in this documentation, which was scribed in my presence. The recorded information has been reviewed and considered.  Devoria AlbeIva Hani Patnode, MD, Concha PyoFACEP     Maelynn Moroney, MD 10/29/16 [id number]

## 2016-10-28 NOTE — ED Triage Notes (Signed)
Pt c/o right lower toothache for the last couple of days

## 2016-10-29 MED ORDER — PENICILLIN V POTASSIUM 500 MG PO TABS: 500.0000 mg | ORAL_TABLET | Freq: Four times a day (QID) | ORAL | 0 refills | Status: AC

## 2016-10-29 MED ORDER — PENICILLIN V POTASSIUM 250 MG PO TABS
500.0000 mg | ORAL_TABLET | Freq: Once | ORAL | Status: AC
  Administered 2016-10-29: 500 mg via ORAL
  Filled 2016-10-29: qty 2

## 2016-10-29 NOTE — Discharge Instructions (Signed)
Take the penicillin until gone. You can take acetaminophen 650 mg every 6 hrs for pain as needed for tooth pain. Keep your appointment with the dentist tomorrow, March 14.

## 2017-08-17 ENCOUNTER — Other Ambulatory Visit: Payer: Self-pay

## 2017-08-17 ENCOUNTER — Encounter (HOSPITAL_COMMUNITY): Payer: Self-pay

## 2017-08-17 ENCOUNTER — Emergency Department (HOSPITAL_COMMUNITY)
Admission: EM | Admit: 2017-08-17 | Discharge: 2017-08-17 | Disposition: A | Discharge status: Home / Self Care | Payer: Self-pay | Attending: Emergency Medicine | Admitting: Emergency Medicine

## 2017-08-17 DIAGNOSIS — Z79899 Other long term (current) drug therapy: Secondary | ICD-10-CM | POA: Insufficient documentation

## 2017-08-17 DIAGNOSIS — Z87891 Personal history of nicotine dependence: Secondary | ICD-10-CM | POA: Insufficient documentation

## 2017-08-17 DIAGNOSIS — K047 Periapical abscess without sinus: Secondary | ICD-10-CM | POA: Insufficient documentation

## 2017-08-17 MED ORDER — BUPIVACAINE-EPINEPHRINE (PF) 0.5% -1:200000 IJ SOLN
1.8000 mL | Freq: Once | INTRAMUSCULAR | Status: AC
  Administered 2017-08-17: 1.8 mL
  Filled 2017-08-17: qty 30

## 2017-08-17 MED ORDER — PENICILLIN V POTASSIUM 500 MG PO TABS: 500.0000 mg | ORAL_TABLET | Freq: Three times a day (TID) | ORAL | 0 refills | Status: DC

## 2017-08-17 MED ORDER — PENICILLIN V POTASSIUM 250 MG PO TABS
500.0000 mg | ORAL_TABLET | Freq: Once | ORAL | Status: AC
  Administered 2017-08-17: 500 mg via ORAL
  Filled 2017-08-17: qty 2

## 2017-08-17 MED ORDER — TRAMADOL HCL 50 MG PO TABS: 50.0000 mg | ORAL_TABLET | Freq: Four times a day (QID) | ORAL | 0 refills | Status: DC | PRN

## 2017-08-17 NOTE — ED Notes (Addendum)
Patient given discharge papers. PA wants to do nerve block with drainage of abscess before patient signs for discharge.

## 2017-08-17 NOTE — Discharge Instructions (Signed)
Take ibuprofen or Tylenol for pain.  Tramadol for severe pain.  Take penicillin as prescribed until all gone for the infection.  Try warm compresses, salt water mouth rinses.  Please follow-up with oral surgery.  See referral provided.  Call them within the next 2 days to see if they can work you in.  Return if worsening symptoms

## 2017-08-17 NOTE — ED Triage Notes (Addendum)
Right lower dental pain/swellingto face x2 days. Took ibuprofen with no relief.

## 2017-08-17 NOTE — ED Provider Notes (Signed)
[facility]ERGENCY DEPARTMENT Provider Note   CSN: [id number] Arrival date & time: 08/17/17  1111     History   Chief Complaint Chief Complaint  Patient presents with  . Dental Pain    HPI Jasmine Wright is a 26 y.o. female.  HPI Jasmine SensingJulie Wright is a 26 y.o. female presents to emergency department complaining of dental pain facial swelling.  Patient states that her swelling started yesterday.  She reports a bad tooth in the area, reports prior infections in that tooth as well.  She states this morning she woke up the swelling was significantly worse.  She has taken Tylenol for her pain which did not help.  She states she is waiting on her insurance to see a dentist, unable to see one at this time.  Denies any fever or chills.  No difficulty swallowing or breathing.  No other complaints.  Past Medical History:  Diagnosis Date  . Headache   . Scoliosis     Patient Active Problem List   Diagnosis Date Noted  . Nexplanon removal 10/26/2014    Past Surgical History:  Procedure Laterality Date  . HERNIA REPAIR    . NO PAST SURGERIES      OB History    Gravida Para Term Preterm AB Living   3 1 1  0 0 1   SAB TAB Ectopic Multiple Live Births   0 0 0 0 1       Home Medications    Prior to Admission medications   Medication Sig Start Date End Date Taking? Authorizing Provider  Prenatal Vit-Fe Fumarate-FA (PRENATAL MULTIVITAMIN) TABS Take 1 tablet by mouth daily. 08/19/11   Constant, Peggy, MD    Family History Family History  Problem Relation Age of Onset  . Hypertension Mother     Social History Social History   Tobacco Use  . Smoking status: Former Smoker    Packs/day: 5.00    Types: Cigarettes  . Smokeless tobacco: Never Used  Substance Use Topics  . Alcohol use: No    Alcohol/week: 0.0 oz  . Drug use: No     Allergies   Patient has no known allergies.   Review of Systems Review of Systems  Constitutional: Negative for chills and fever.  HENT:  Positive for dental problem and facial swelling. Negative for trouble swallowing.   Respiratory: Negative for chest tightness and shortness of breath.   Neurological: Positive for headaches.  All other systems reviewed and are negative.    Physical Exam Updated Vital Signs BP 111/82 (BP Location: Right Arm)   Pulse 94   Temp 99.1 F (37.3 C) (Oral)   Resp 15   Ht 5\' 4"  (1.626 m)   Wt 58.1 kg (128 lb)   LMP 08/10/2017   SpO2 100%   Breastfeeding? Unknown   BMI 21.97 kg/m   Physical Exam  Constitutional: She appears well-developed and well-nourished. No distress.  HENT:  Decayed and chipped right lower second molar with surrounding.  There is significant swelling to the right face over the right mandible.  There is no swelling under the tongue.  There is no submandibular fullness.  Oropharynx is normal.  Eyes: Conjunctivae are normal.  Neck: Neck supple.  Neurological: She is alert.  Skin: Skin is warm and dry.  Nursing note and vitals reviewed.    ED Treatments / Results  Labs (all labs ordered are listed, but only abnormal results are displayed) Labs Reviewed - No data to display  EKG  EKG Interpretation None       Radiology No results found.  Procedures Dental Block Date/Time: 08/17/2017 1:52 PM Performed by: Jaynie CrumbleKirichenko, Tannah Dreyfuss, PA-C Authorized by: Jaynie CrumbleKirichenko, Metzli Pollick, PA-C   Consent:    Consent obtained:  Verbal   Consent given by:  Patient   Risks discussed:  Allergic reaction, infection, nerve damage and swelling   Alternatives discussed:  No treatment Indications:    Indications: dental abscess   Location:    Block type:  Mental   Laterality:  Right Procedure details (see MAR for exact dosages):    Anesthetic injected:  Bupivacaine 0.5% WITH epi   Injection procedure:  Anatomic landmarks identified Post-procedure details:    Outcome:  Pain relieved   Patient tolerance of procedure:  Tolerated well, no immediate complications Aspiration of  blood/fluid Date/Time: 08/17/2017 1:54 PM Performed by: Jaynie CrumbleKirichenko, Mackinley Kiehn, PA-C Authorized by: Jaynie CrumbleKirichenko, Keifer Habib, PA-C  Consent: Verbal consent obtained. Consent given by: patient Patient understanding: patient states understanding of the procedure being performed Patient consent: the patient's understanding of the procedure matches consent given Test results: test results available and properly labeled Site marked: the operative site was marked Patient identity confirmed: verbally with patient Preparation: Patient was prepped and draped in the usual sterile fashion. Patient tolerance: Patient tolerated the procedure well with no immediate complications Comments: Needle aspiration of a dental abscess, using 18guage needle    (including critical care time)  Medications Ordered in ED Medications  penicillin v potassium (VEETID) tablet 500 mg (not administered)     Initial Impression / Assessment and Plan / ED Course  I have reviewed the triage vital signs and the nursing notes.  Pertinent labs & imaging results that were available during my care of the patient were reviewed by me and considered in my medical decision making (see chart for details).     Patient in emergency department with dental infection and facial swelling.  Abscess aspirated with a needle after dental block performed.  Approximately 3 cc of purulent drainage obtained.. Will start on penicillin.  No signs of Ludwig's angina at this time.  She is instructed to follow-up with oral surgery.  Return precautions discussed.  Vitals:   08/17/17 1141  BP: 111/82  Pulse: 94  Resp: 15  Temp: 99.1 F (37.3 C)  TempSrc: Oral  SpO2: 100%  Weight: 58.1 kg (128 lb)  Height: 5\' 4"  (1.626 m)     Final Clinical Impressions(s) / ED Diagnoses   Final diagnoses:  Dental abscess    ED Discharge Orders        Ordered    penicillin v potassium (VEETID) 500 MG tablet  3 times daily     08/17/17 1306    traMADol  (ULTRAM) 50 MG tablet  Every 6 hours PRN     08/17/17 1306          Jaynie CrumbleKirichenko, Ellese Julius, PA-C 08/17/17 1355    Donnetta Hutchingook, Brian, MD 08/19/17 1358

## 2024-02-02 ENCOUNTER — Ambulatory Visit: Payer: Self-pay | Admitting: Family Medicine

## 2024-07-12 ENCOUNTER — Encounter (HOSPITAL_COMMUNITY): Payer: Self-pay | Admitting: Oral Surgery

## 2024-07-12 ENCOUNTER — Other Ambulatory Visit: Payer: Self-pay

## 2024-07-12 NOTE — H&P (Signed)
  Patient: Jasmine Wright  PID: [id number]  DOB: October 12, 1990  SEX: Female   Patient referred by DDS for extraction teeth for partial dentures (already made).   CC: Bad teeth. 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  Past Medical History:  Smoker    Medications: Toprimate, Clindamycin, Ibuprophen 800    Allergies:     NKDA    Surgeries:   Tubal ligation     Social History       Smoking:            Alcohol: Drug use:                             Exam: BMI 22. Multiple caries teeth #'s 1, 2, 3, 7, 8, 9, 10, 11, 12, 16, 17, 18, 19, 29, 30, 31, 32.  No purulence, edema, fluctuance, trismus. Oral cancer screening negative. Pharynx clear. No lymphadenopathy.  Panorex:Multiple caries teeth #'s 1, 2, 3, 7, 8, 9, 10, 11, 12, 16, 17, 18, 19, 29, 30, 31, 32.  Assessment: ASA 2. Non-restorable  teeth #'s 1, 2, 3, 7, 8, 9, 10, 11, 12, 16, 17, 18, 19, 29, 30, 31, 32.             Plan: Extraction Teeth #'s 1, 2, 3, 7, 8, 9, 10, 11, 12, 16, 17, 18, 19, 29, 30, 31, 32. Alveoloplasty. Insert immediate partial dentures. Hospital Day surgery.                 Rx: n               Risks and complications explained. Questions answered.   Glendia EMERSON Primrose, DMD

## 2024-07-12 NOTE — Progress Notes (Signed)
 Pre-op surgical orders have been requested.

## 2024-07-12 NOTE — Progress Notes (Signed)
 SDW call  Patient was given pre-op instructions over the phone. Patient verbalized understanding of instructions provided.     PCP -Elsie Glendia Brought @ [facility] in [place], [place] Cardiologist - Denies Pulmonary: Denies   PPM/ICD - Denies Device Orders - n/a Rep Notified - n/a   Chest x-ray - n/a EKG -  n/a Stress Test - denies ECHO - denies Cardiac Cath - denies  Sleep Study/sleep apnea/CPAP - denies  NON-diabetic   Blood Thinner Instructions: denies  Aspirin Instructions: denies   ERAS Protcol - NPO PRE-SURGERY Ensure or G2- none   COVID TEST- n/a Patient had covid on 05-17-24, all symptoms have resolved.     Anesthesia review: n/a   Patient denies shortness of breath, fever, cough and chest pain over the phone call  Surgical Instructions   Your procedure is scheduled on Friday, Nov 28th. Report to [facility] Main Entrance A at 6:30 A.M., then check in with the Admitting office. Any questions or running late day of surgery: call [phone]  Questions prior to your surgery date: call [phone], Monday-Friday, 8am-4pm. If you experience any cold or flu symptoms such as cough, fever, chills, shortness of breath, etc. between now and your scheduled surgery, please notify us  at the above number.     Remember:  Do not eat or drink after midnight the night before your surgery. This includes no gum, mints, or hard candy.   Take these medicines the morning of surgery with A SIP OF WATER: NONE  One week prior to surgery, STOP taking any Aspirin (unless otherwise instructed by your surgeon) Aleve, Naproxen, Ibuprofen , Motrin , Advil , Goody's, BC's, all herbal medications, fish oil, and non-prescription vitamins.                     Do NOT Smoke (Tobacco/Vaping) or drink for 24 hours prior to your procedure.   You will be asked to remove any contacts, glasses, piercing's, hearing aid's, dentures/partials prior to surgery. Please bring cases for these items if needed.     Patients discharged the day of surgery will not be allowed to drive home, and someone needs to stay with them for 24 hours.     Pre-operative CHG Bathing Instructions    DO NOT use if you have an allergy to chlorhexidine/CHG or antibacterial soaps. If your skin becomes reddened or irritated, stop using the CHG and notify one of our RNs at [phone].              TAKE A SHOWER THE NIGHT BEFORE SURGERY AND THE DAY OF SURGERY    Please keep in mind the following:  DO NOT shave, including legs and underarms, 48 hours prior to surgery.   Place clean sheets on your bed the night before surgery Use a clean washcloth and towel for each shower. DO NOT sleep with pet's night before surgery.  CHG Shower Instructions:  Wash with antibacterial soap  2. Pat dry with a clean towel  3. Put on clean pajamas    Additional instructions for the day of surgery: DO NOT APPLY any lotions, deodorants, cologne, perfumes, or powders.   Do not wear jewelry or bring valuables to the hospital. [facility] is not responsible for valuables/personal belongings. Do not wear makeup, nail polish, gel polish, artificial nails, or any other type of covering on natural nails (fingers and toes) Put on clean/comfortable clothes.  Please brush your teeth.

## 2024-07-14 ENCOUNTER — Ambulatory Visit: Admit: 2024-07-14 | Admitting: Oral Surgery

## 2024-07-15 ENCOUNTER — Encounter (HOSPITAL_COMMUNITY): Payer: Self-pay | Admitting: Oral Surgery

## 2024-07-15 NOTE — Anesthesia Preprocedure Evaluation (Addendum)
 Anesthesia Evaluation  Patient identified by MRN, date of birth, ID band Patient awake    Reviewed: Allergy & Precautions, H&P , NPO status , Patient's Chart, lab work & pertinent test results  Airway Mallampati: II  TM Distance: >3 FB Neck ROM: Full    Dental no notable dental hx. (+) Poor Dentition, Dental Advisory Given   Pulmonary neg pulmonary ROS, Current Smoker and Patient abstained from smoking.   Pulmonary exam normal breath sounds clear to auscultation       Cardiovascular Exercise Tolerance: Good negative cardio ROS  Rhythm:Regular Rate:Normal     Neuro/Psych  Headaches  negative psych ROS   GI/Hepatic negative GI ROS, Neg liver ROS,,,  Endo/Other  negative endocrine ROS    Renal/GU negative Renal ROS  negative genitourinary   Musculoskeletal   Abdominal   Peds  Hematology negative hematology ROS (+)   Anesthesia Other Findings   Reproductive/Obstetrics negative OB ROS                              Anesthesia Physical Anesthesia Plan  ASA: 2  Anesthesia Plan: General   Post-op Pain Management: Tylenol  PO (pre-op)*   Induction: Intravenous  PONV Risk Score and Plan: 3 and Ondansetron , Dexamethasone and Midazolam  Airway Management Planned: Nasal ETT and Video Laryngoscope Planned  Additional Equipment:   Intra-op Plan:   Post-operative Plan: Extubation in OR  Informed Consent: I have reviewed the patients History and Physical, chart, labs and discussed the procedure including the risks, benefits and alternatives for the proposed anesthesia with the patient or authorized representative who has indicated his/her understanding and acceptance.     Dental advisory given  Plan Discussed with: CRNA  Anesthesia Plan Comments:          Anesthesia Quick Evaluation

## 2024-07-16 ENCOUNTER — Ambulatory Visit (HOSPITAL_COMMUNITY)
Admission: RE | Admit: 2024-07-16 | Discharge: 2024-07-16 | Disposition: A | Discharge status: Home / Self Care | Attending: Oral Surgery | Admitting: Oral Surgery

## 2024-07-16 ENCOUNTER — Encounter (HOSPITAL_COMMUNITY)
Admission: RE | Disposition: A | Discharge status: Home / Self Care | Payer: Self-pay | Source: Home / Self Care | Attending: Oral Surgery

## 2024-07-16 ENCOUNTER — Ambulatory Visit (HOSPITAL_COMMUNITY): Payer: Self-pay | Admitting: Anesthesiology

## 2024-07-16 ENCOUNTER — Encounter (HOSPITAL_COMMUNITY): Payer: Self-pay | Admitting: Oral Surgery

## 2024-07-16 ENCOUNTER — Other Ambulatory Visit: Payer: Self-pay

## 2024-07-16 DIAGNOSIS — K029 Dental caries, unspecified: Secondary | ICD-10-CM | POA: Diagnosis present

## 2024-07-16 DIAGNOSIS — F17200 Nicotine dependence, unspecified, uncomplicated: Secondary | ICD-10-CM | POA: Insufficient documentation

## 2024-07-16 DIAGNOSIS — K0889 Other specified disorders of teeth and supporting structures: Secondary | ICD-10-CM

## 2024-07-16 HISTORY — PX: TOOTH EXTRACTION: SHX859

## 2024-07-16 LAB — CBC
HCT: 41.7 % (ref 36.0–46.0)
Hemoglobin: 13.3 g/dL (ref 12.0–15.0)
MCH: 29.6 pg (ref 26.0–34.0)
MCHC: 31.9 g/dL (ref 30.0–36.0)
MCV: 92.9 fL (ref 80.0–100.0)
Platelets: 288 K/uL (ref 150–400)
RBC: 4.49 MIL/uL (ref 3.87–5.11)
RDW: 12.8 % (ref 11.5–15.5)
WBC: 8.2 K/uL (ref 4.0–10.5)
nRBC: 0 % (ref 0.0–0.2)

## 2024-07-16 LAB — POCT PREGNANCY, URINE: Preg Test, Ur: NEGATIVE

## 2024-07-16 SURGERY — DENTAL RESTORATION/EXTRACTIONS
Anesthesia: General | Site: Mouth

## 2024-07-16 SURGERY — DENTAL RESTORATION/EXTRACTIONS
Anesthesia: General

## 2024-07-16 MED ORDER — CEFAZOLIN SODIUM-DEXTROSE 2-4 GM/100ML-% IV SOLN
2.0000 g | INTRAVENOUS | Status: AC
  Administered 2024-07-16: 2 g via INTRAVENOUS

## 2024-07-16 MED ORDER — PROPOFOL 10 MG/ML IV BOLUS
INTRAVENOUS | Status: DC | PRN
  Administered 2024-07-16: 130 mg via INTRAVENOUS

## 2024-07-16 MED ORDER — ORAL CARE MOUTH RINSE: 15.0000 mL | Freq: Once | OROMUCOSAL | Status: AC

## 2024-07-16 MED ORDER — FENTANYL CITRATE (PF) 250 MCG/5ML IJ SOLN
INTRAMUSCULAR | Status: DC | PRN
  Administered 2024-07-16: 50 ug via INTRAVENOUS

## 2024-07-16 MED ORDER — ACETAMINOPHEN 500 MG PO TABS: 1000.0000 mg | ORAL_TABLET | Freq: Once | ORAL | Status: AC

## 2024-07-16 MED ORDER — ROCURONIUM BROMIDE 10 MG/ML (PF) SYRINGE
PREFILLED_SYRINGE | INTRAVENOUS | Status: DC | PRN
Start: 2024-07-16 — End: 2024-07-16
  Administered 2024-07-16: 50 mg via INTRAVENOUS

## 2024-07-16 MED ORDER — ACETAMINOPHEN 500 MG PO TABS
ORAL_TABLET | ORAL | Status: AC
  Administered 2024-07-16: 1000 mg via ORAL
  Filled 2024-07-16: qty 2

## 2024-07-16 MED ORDER — CHLORHEXIDINE GLUCONATE 0.12 % MT SOLN
OROMUCOSAL | Status: AC
  Administered 2024-07-16: 15 mL via OROMUCOSAL
  Filled 2024-07-16: qty 15

## 2024-07-16 MED ORDER — MIDAZOLAM HCL (PF) 2 MG/2ML IJ SOLN
INTRAMUSCULAR | Status: DC | PRN
  Administered 2024-07-16: 2 mg via INTRAVENOUS

## 2024-07-16 MED ORDER — SUGAMMADEX SODIUM 200 MG/2ML IV SOLN
INTRAVENOUS | Status: DC | PRN
  Administered 2024-07-16: 150 mg via INTRAVENOUS

## 2024-07-16 MED ORDER — CEFAZOLIN SODIUM-DEXTROSE 2-4 GM/100ML-% IV SOLN
INTRAVENOUS | Status: AC
  Filled 2024-07-16: qty 100

## 2024-07-16 MED ORDER — OXYCODONE-ACETAMINOPHEN 5-325 MG PO TABS: 1.0000 | ORAL_TABLET | ORAL | 0 refills | Status: AC | PRN

## 2024-07-16 MED ORDER — ONDANSETRON HCL 4 MG/2ML IJ SOLN
INTRAMUSCULAR | Status: DC | PRN
  Administered 2024-07-16: 4 mg via INTRAVENOUS

## 2024-07-16 MED ORDER — 0.9 % SODIUM CHLORIDE (POUR BTL) OPTIME
TOPICAL | Status: DC | PRN
  Administered 2024-07-16: 1000 mL

## 2024-07-16 MED ORDER — SODIUM CHLORIDE 0.9 % IR SOLN
Status: DC | PRN
Start: 2024-07-16 — End: 2024-07-16
  Administered 2024-07-16: 1000 mL

## 2024-07-16 MED ORDER — LIDOCAINE-EPINEPHRINE 2 %-1:100000 IJ SOLN
INTRAMUSCULAR | Status: DC | PRN
  Administered 2024-07-16: 17 mL via INTRADERMAL

## 2024-07-16 MED ORDER — ROCURONIUM BROMIDE 10 MG/ML (PF) SYRINGE
PREFILLED_SYRINGE | INTRAVENOUS | Status: AC
  Filled 2024-07-16: qty 10

## 2024-07-16 MED ORDER — AMOXICILLIN 500 MG PO CAPS: 500.0000 mg | ORAL_CAPSULE | Freq: Three times a day (TID) | ORAL | 0 refills | Status: AC

## 2024-07-16 MED ORDER — CHLORHEXIDINE GLUCONATE 0.12 % MT SOLN: 15.0000 mL | Freq: Two times a day (BID) | OROMUCOSAL | 0 refills | Status: AC

## 2024-07-16 MED ORDER — MIDAZOLAM HCL 2 MG/2ML IJ SOLN
INTRAMUSCULAR | Status: AC
  Filled 2024-07-16: qty 2

## 2024-07-16 MED ORDER — OXYMETAZOLINE HCL 0.05 % NA SOLN
NASAL | Status: AC
  Filled 2024-07-16: qty 30

## 2024-07-16 MED ORDER — OXYMETAZOLINE HCL 0.05 % NA SOLN
NASAL | Status: DC | PRN
  Administered 2024-07-16 (×2): 2 via NASAL

## 2024-07-16 MED ORDER — ONDANSETRON HCL 4 MG/2ML IJ SOLN
INTRAMUSCULAR | Status: AC
  Filled 2024-07-16: qty 2

## 2024-07-16 MED ORDER — LIDOCAINE 2% (20 MG/ML) 5 ML SYRINGE
INTRAMUSCULAR | Status: DC | PRN
Start: 2024-07-16 — End: 2024-07-16
  Administered 2024-07-16: 60 mg via INTRAVENOUS

## 2024-07-16 MED ORDER — LACTATED RINGERS IV SOLN: INTRAVENOUS | Status: DC

## 2024-07-16 MED ORDER — LIDOCAINE 2% (20 MG/ML) 5 ML SYRINGE
INTRAMUSCULAR | Status: AC
  Filled 2024-07-16: qty 5

## 2024-07-16 MED ORDER — FENTANYL CITRATE (PF) 100 MCG/2ML IJ SOLN
INTRAMUSCULAR | Status: AC
  Filled 2024-07-16: qty 2

## 2024-07-16 MED ORDER — CHLORHEXIDINE GLUCONATE 0.12 % MT SOLN: 15.0000 mL | Freq: Once | OROMUCOSAL | Status: AC

## 2024-07-16 MED ORDER — DEXAMETHASONE SOD PHOSPHATE PF 10 MG/ML IJ SOLN
INTRAMUSCULAR | Status: DC | PRN
  Administered 2024-07-16: 10 mg via INTRAVENOUS

## 2024-07-16 SURGICAL SUPPLY — 27 items
BAG COUNTER SPONGE SURGICOUNT (BAG) IMPLANT
BLADE SURG 15 STRL LF DISP TIS (BLADE) ×1 IMPLANT
BUR CROSS CUT FISSURE 1.6 (BURR) ×1 IMPLANT
BUR EGG ELITE 4.0 (BURR) IMPLANT
BUR SRG MED 1.2XXCUT FSSR (BURR) IMPLANT
CANISTER SUCTION 3000ML PPV (SUCTIONS) ×1 IMPLANT
COVER SURGICAL LIGHT HANDLE (MISCELLANEOUS) ×1 IMPLANT
GAUZE PACKING FOLDED 1IN STRL (GAUZE/BANDAGES/DRESSINGS) IMPLANT
GAUZE PACKING FOLDED 2 STR (GAUZE/BANDAGES/DRESSINGS) ×1 IMPLANT
GLOVE BIO SURGEON STRL SZ8 (GLOVE) ×1 IMPLANT
GOWN STRL REUS W/ TWL LRG LVL3 (GOWN DISPOSABLE) ×1 IMPLANT
GOWN STRL REUS W/ TWL XL LVL3 (GOWN DISPOSABLE) ×1 IMPLANT
IV 0.9% NACL 1000 ML (IV SOLUTION) ×1 IMPLANT
KIT BASIN OR (CUSTOM PROCEDURE TRAY) ×1 IMPLANT
KIT TURNOVER KIT B (KITS) ×1 IMPLANT
NDL HYPO 25GX1X1/2 BEV (NEEDLE) ×2 IMPLANT
PAD ARMBOARD POSITIONER FOAM (MISCELLANEOUS) ×1 IMPLANT
SLEEVE IRRIGATION ELITE 7 (MISCELLANEOUS) ×1 IMPLANT
SOLN 0.9% NACL POUR BTL 1000ML (IV SOLUTION) ×1 IMPLANT
SPIKE FLUID TRANSFER (MISCELLANEOUS) IMPLANT
SUT CHROMIC 3 0 SH 27 (SUTURE) IMPLANT
SUT CHROMIC 4 0 RB 1X27 (SUTURE) ×1 IMPLANT
SYR BULB IRRIG 60ML STRL (SYRINGE) ×1 IMPLANT
SYR CONTROL 10ML LL (SYRINGE) ×1 IMPLANT
TRAY ENT MC OR (CUSTOM PROCEDURE TRAY) ×1 IMPLANT
TUBING IRRIGATION (MISCELLANEOUS) ×1 IMPLANT
YANKAUER SUCT BULB TIP NO VENT (SUCTIONS) ×1 IMPLANT

## 2024-07-16 NOTE — Transfer of Care (Signed)
 Immediate Anesthesia Transfer of Care Note  Patient: Jasmine Wright  Procedure(s) Performed: DENTAL RESTORATION/EXTRACTIONS WITH ALVEOPLASTY (Mouth)  Patient Location: PACU  Anesthesia Type:General  Level of Consciousness: awake  Airway & Oxygen Therapy: Patient Spontanous Breathing and Patient connected to face mask oxygen  Post-op Assessment: Report given to RN, Post -op Vital signs reviewed and stable, and Patient moving all extremities  Post vital signs: Reviewed and stable  Last Vitals:  Vitals Value Taken Time  BP 129/73 07/16/24 10:02  Temp 36.5 C 07/16/24 10:03  Pulse 97 07/16/24 10:04  Resp 14 07/16/24 10:04  SpO2 100 % 07/16/24 10:04  Vitals shown include unfiled device data.  Last Pain:  Vitals:   07/16/24 0639  TempSrc: Oral         Complications: No notable events documented.

## 2024-07-16 NOTE — Anesthesia Postprocedure Evaluation (Signed)
 Anesthesia Post Note  Patient: Andilyn Bettcher  Procedure(s) Performed: DENTAL RESTORATION/EXTRACTIONS WITH ALVEOPLASTY (Mouth)     Patient location during evaluation: PACU Anesthesia Type: General Level of consciousness: awake and alert Pain management: pain level controlled Vital Signs Assessment: post-procedure vital signs reviewed and stable Respiratory status: spontaneous breathing, nonlabored ventilation and respiratory function stable Cardiovascular status: blood pressure returned to baseline and stable Postop Assessment: no apparent nausea or vomiting Anesthetic complications: no   No notable events documented.  Last Vitals:  Vitals:   07/16/24 1030 07/16/24 1045  BP: 120/79 117/74  Pulse: 82 78  Resp: 11 13  Temp:  36.6 C  SpO2: 100% 100%    Last Pain:  Vitals:   07/16/24 1045  TempSrc:   PainSc: 0-No pain                 Kenedi Cilia,W. EDMOND

## 2024-07-16 NOTE — Anesthesia Procedure Notes (Signed)
 Procedure Name: Intubation Date/Time: 07/16/2024 9:14 AM  Performed by: Erlene Powell POUR, CRNAPre-anesthesia Checklist: Patient identified, Emergency Drugs available, Suction available and Patient being monitored Patient Re-evaluated:Patient Re-evaluated prior to induction Oxygen Delivery Method: Circle System Utilized Preoxygenation: Pre-oxygenation with 100% oxygen Induction Type: IV induction Ventilation: Mask ventilation without difficulty Laryngoscope Size: Glidescope and 3 Grade View: Grade I Nasal Tubes: Nasal Rae and Nasal prep performed Tube size: 7.0 mm Number of attempts: 1 Placement Confirmation: ETT inserted through vocal cords under direct vision, positive ETCO2 and breath sounds checked- equal and bilateral Secured at: 26 cm Tube secured with: Tape Dental Injury: Teeth and Oropharynx as per pre-operative assessment

## 2024-07-16 NOTE — Op Note (Signed)
 07/16/2024  9:55 AM  PATIENT:  Jasmine Wright  33 y.o. female  PRE-OPERATIVE DIAGNOSIS:  NON RESTORABLE Teeth #'s 1, 2, 3, 7, 8, 9, 10, 11, 12, 16, 17, 18, 19, 29, 30, 31, 32  POST-OPERATIVE DIAGNOSIS:  SAME  PROCEDURE:  Procedure(s): EXTRACTIONS teeth #'s 1, 2, 3, 7, 8, 9, 10, 11, 12, 16, 17, 18, 19, 29, 30, 31, 32; ALVEOPLASTY  SURGEON:  Surgeon(s): Sheryle Hamilton, DMD  ANESTHESIA:   local and general  EBL:  minimal  DRAINS: none   SPECIMEN:  No Specimen  COUNTS:  YES  PLAN OF CARE: Discharge to home after PACU  PATIENT DISPOSITION:  PACU - hemodynamically stable.   PROCEDURE DETAILS: Dictation # [id number]  Hamilton EMERSON Sheryle, DMD 07/16/2024 9:55 AM

## 2024-07-16 NOTE — Op Note (Signed)
 NAME: Jasmine Wright, Jasmine Wright MEDICAL RECORD NO: [id number] ACCOUNT NO: [id number] DATE OF BIRTH: Nov 10, 1990 FACILITY: [facility] LOCATION: [facility] PHYSICIAN: Glendia EMERSON Primrose, DDS  Operative Report   DATE OF PROCEDURE: 07/16/2024   PREOPERATIVE DIAGNOSIS:  Nonrestorable teeth numbers 1, 2, 3, 7, 8, 9, 10, 11, 12, 16, 17, 18, 19, 29, 30, 31, and 32 secondary to dental caries.  POSTOPERATIVE DIAGNOSIS:  Nonrestorable teeth numbers 1, 2, 3, 7, 8, 9, 10, 11, 12, 16, 17, 18, 19, 29, 30, 31, and 32 secondary to dental caries.   PROCEDURE:  Extraction teeth numbers per above with alveoloplasty.  SURGEON:  Glendia EMERSON Primrose, DDS  ANESTHESIA:  General nasal intubation, Dr. Epifanio attending.  DESCRIPTION OF PROCEDURE:  The patient was taken to the operating room, placed on the table in the supine position. General anesthesia was administered.  A nasal endotracheal tube was placed and secured.  The eyes were protected.  The patient was draped  for surgery.  Timeout was performed.  The posterior pharynx was suctioned and a throat pack was placed.  2% lidocaine  1 to 100,000 epinephrine  was infiltrated in an inferior alveolar block on the right and left side and in buccal and palatal infiltration  around the teeth to be removed.  In the maxilla, the left side was operated first.  A 15 blade was used to make an incision around teeth numbers 17, 18, and 19 in the mandible on the buccal and lingual sulcus next to the teeth and in the left maxilla  around tooth number 16 buccally and palatally, the periosteum was reflected from around these teeth.  The teeth were elevated with a dental elevator and removed from the mouth with the dental forceps.  The sockets were curetted, irrigated, and closed  with 3-0 chromic.  Then the anterior teeth were removed in the maxilla beginning at tooth number 12.  An incision was created to tooth numbers 11, 10, 9, 8, and 7 in both the buccal and palatal gingival sulcus.  The periosteum  was reflected.  The teeth were elevated with a  301 elevator, removed from the mouth with the dental forceps.  The tissue was trimmed to allow for closure and then the sockets were curetted and debrided and closed with 3-0 chromic.  Then in the right side, the 15 blade was used to make an incision around teeth numbers 29, 30, 31, and 32.  The periosteum was reflected.  The teeth were elevated and removed with the dental forceps.  The sockets were curetted.  The periosteum was  reflected to expose the alveolar crest, which is irregular, and then the alveoloplasty was performed in the right posterior mandible.  Then the area was irrigated and closed with 3-0 chromic.  In the right maxilla, the 15 blade was used to make an incision around teeth numbers 1, 2, and 3 buccally and palatally in the gingival sulcus.  The periosteum was reflected.  The teeth were elevated and removed with the dental forceps.  The sockets were  curetted, irrigated, and closed with 3-0 chromic.  The oral cavity was irrigated and suctioned.  The throat pack was removed.  The patient was left in care of anesthesia for extubation and transport to recovery with plans for discharge home through day  surgery.  ESTIMATED BLOOD LOSS:  Minimum.  COMPLICATIONS:  None.  SPECIMENS:  None.  COUNTS:  Correct.    AMEY.BABA D: 07/16/2024 9:59:12 am T: 07/16/2024 10:16:00 am  JOB: [id number]/ [id number]

## 2024-07-16 NOTE — H&P (Signed)
 H&P documentation  -History and Physical Reviewed  -Patient has been re-examined  -No change in the plan of care  Jasmine Wright

## 2024-07-17 ENCOUNTER — Encounter (HOSPITAL_COMMUNITY): Payer: Self-pay | Admitting: Oral Surgery
